# Patient Record
Sex: Male | Born: 1970 | Race: White | Hispanic: No | Marital: Single | Smoking: Current every day smoker
Health system: Southern US, Community
[De-identification: ages and names within clinical notes are randomized; demographics above are authoritative.]

---

## 2004-05-02 ENCOUNTER — Emergency Department (HOSPITAL_COMMUNITY): Admission: EM | Admit: 2004-05-02 | Discharge: 2004-05-02 | Payer: Self-pay | Admitting: Family Medicine

## 2006-11-10 ENCOUNTER — Emergency Department (HOSPITAL_COMMUNITY): Admission: EM | Admit: 2006-11-10 | Discharge: 2006-11-10 | Payer: Self-pay | Admitting: Emergency Medicine

## 2010-03-22 ENCOUNTER — Observation Stay (HOSPITAL_COMMUNITY): Admission: EM | Admit: 2010-03-22 | Discharge: 2010-03-23 | Payer: Self-pay | Admitting: Emergency Medicine

## 2010-03-22 DIAGNOSIS — Z8709 Personal history of other diseases of the respiratory system: Secondary | ICD-10-CM | POA: Insufficient documentation

## 2010-03-23 ENCOUNTER — Encounter: Payer: Self-pay | Admitting: Physician Assistant

## 2010-03-28 ENCOUNTER — Emergency Department (HOSPITAL_COMMUNITY): Admission: EM | Admit: 2010-03-28 | Discharge: 2010-03-28 | Payer: Self-pay | Admitting: Emergency Medicine

## 2010-10-03 NOTE — Letter (Signed)
Summary: Discharge Summary  Discharge Summary   Imported By: Arta Bruce 04/26/2010 10:46:17  _____________________________________________________________________  External Attachment:    Type:   Image     Comment:   External Document

## 2010-11-17 LAB — CBC
HCT: 40.3 % (ref 39.0–52.0)
Hemoglobin: 13.5 g/dL (ref 13.0–17.0)
MCH: 31.5 pg (ref 26.0–34.0)
MCH: 31.6 pg (ref 26.0–34.0)
MCHC: 33.4 g/dL (ref 30.0–36.0)
Platelets: 274 10*3/uL (ref 150–400)
RBC: 4.27 MIL/uL (ref 4.22–5.81)
RDW: 13.7 % (ref 11.5–15.5)
RDW: 14.3 % (ref 11.5–15.5)

## 2010-11-17 LAB — COMPREHENSIVE METABOLIC PANEL
AST: 28 U/L (ref 0–37)
Albumin: 1.7 g/dL — ABNORMAL LOW (ref 3.5–5.2)
Alkaline Phosphatase: 128 U/L — ABNORMAL HIGH (ref 39–117)
Chloride: 106 mEq/L (ref 96–112)
Creatinine, Ser: 1.66 mg/dL — ABNORMAL HIGH (ref 0.4–1.5)
GFR calc Af Amer: 56 mL/min — ABNORMAL LOW (ref >60)
Glucose, Bld: 108 mg/dL — ABNORMAL HIGH (ref 70–99)

## 2010-11-17 LAB — DIFFERENTIAL
Basophils Absolute: 0 10*3/uL (ref 0.0–0.1)
Basophils Relative: 0 % (ref 0–1)
Eosinophils Absolute: 0 10*3/uL (ref 0.0–0.7)
Lymphocytes Relative: 15 % (ref 12–46)
Lymphs Abs: 3.2 10*3/uL (ref 0.7–4.0)

## 2010-11-17 LAB — CULTURE, BLOOD (ROUTINE X 2): Culture: NO GROWTH

## 2010-11-17 LAB — RAPID URINE DRUG SCREEN, HOSP PERFORMED: Cocaine: NOT DETECTED

## 2010-11-17 LAB — POCT I-STAT, CHEM 8
BUN: 37 mg/dL — ABNORMAL HIGH (ref 6–23)
Sodium: 132 mEq/L — ABNORMAL LOW (ref 135–145)
TCO2: 24 mmol/L (ref 0–100)

## 2011-07-21 ENCOUNTER — Encounter: Payer: Self-pay | Admitting: *Deleted

## 2011-07-21 ENCOUNTER — Emergency Department (HOSPITAL_COMMUNITY)
Admission: EM | Admit: 2011-07-21 | Discharge: 2011-07-21 | Disposition: A | Discharge status: Home / Self Care | Payer: Self-pay | Attending: Emergency Medicine | Admitting: Emergency Medicine

## 2011-07-21 DIAGNOSIS — M545 Low back pain, unspecified: Secondary | ICD-10-CM | POA: Insufficient documentation

## 2011-07-21 DIAGNOSIS — M549 Dorsalgia, unspecified: Secondary | ICD-10-CM

## 2011-07-21 MED ORDER — IBUPROFEN 600 MG PO TABS: 600.0000 mg | ORAL_TABLET | Freq: Three times a day (TID) | ORAL | Status: AC | PRN

## 2011-07-21 MED ORDER — OXYCODONE-ACETAMINOPHEN 5-325 MG PO TABS
2.0000 | ORAL_TABLET | Freq: Once | ORAL | Status: AC
  Administered 2011-07-21: 2 via ORAL
  Filled 2011-07-21: qty 2

## 2011-07-21 MED ORDER — DIAZEPAM 5 MG PO TABS
5.0000 mg | ORAL_TABLET | Freq: Once | ORAL | Status: AC
  Administered 2011-07-21: 5 mg via ORAL
  Filled 2011-07-21: qty 1

## 2011-07-21 MED ORDER — HYDROCODONE-ACETAMINOPHEN 5-325 MG PO TABS: 1.0000 | ORAL_TABLET | ORAL | Status: AC | PRN
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
## 2011-07-21 NOTE — ED Provider Notes (Signed)
History     CSN: [id number] Arrival date & time: 07/21/2011  4:19 PM   First MD Initiated Contact with Patient 07/21/11 1735      Chief Complaint  Patient presents with  . Back Pain    (Consider location/radiation/quality/duration/timing/severity/associated sxs/prior treatment) Patient is a 40 y.o. male presenting with back pain. The history is provided by the patient.  Back Pain    patient reports history of chronic low back pain that at times has exacerbations.  Reports worsening right back pain with radiation under his right thigh that started 2 days ago.  Spent on improved by ibuprofen at home.  Is worsened by movement and by palpation.  His had no weakness or numbness of his legs.  He said no paranasal numbness.  He's had no difficulty urinating.  No fever or chills.  History of similar symptoms before in the past.  A back injury approximately 20 years ago and since has had exacerbations of his pain.  Is not on any chronic narcotics.  His symptoms are moderate to severe they're constant.  History reviewed. No pertinent past medical history.  History reviewed. No pertinent past surgical history.  History reviewed. No pertinent family history.  History  Substance Use Topics  . Smoking status: Current Everyday Smoker -- 1.0 packs/day    Types: Cigarettes  . Smokeless tobacco: Not on file  . Alcohol Use: No      Review of Systems  Musculoskeletal: Positive for back pain.  All other systems reviewed and are negative.    Allergies  Review of patient's allergies indicates no known allergies.  Home Medications   Current Outpatient Rx  Name Route Sig Dispense Refill  . HYDROCODONE-ACETAMINOPHEN 5-325 MG PO TABS Oral Take 1 tablet by mouth every 4 (four) hours as needed for pain. 20 tablet 0  . IBUPROFEN 600 MG PO TABS Oral Take 1 tablet (600 mg total) by mouth every 8 (eight) hours as needed for pain. 15 tablet 0    BP 105/64  Pulse 106  Temp(Src) 97.9 F (36.6 C)  (Oral)  Resp 18  SpO2 99%  Physical Exam  Constitutional: He is oriented to person, place, and time. He appears well-developed and well-nourished.  HENT:  Head: Normocephalic.  Eyes: EOM are normal.  Neck: Normal range of motion.  Pulmonary/Chest: Effort normal.  Musculoskeletal: Normal range of motion.       No thoracic or lumbar spine tenderness.  Right paralumbar spinal tenderness.   Neurological: He is alert and oriented to person, place, and time.       Normal strength 5 out of 5 in his right lower extremity major muscle groups.   Skin: Skin is warm and dry.  Psychiatric: He has a normal mood and affect.    ED Course  Procedures (including critical care time)  Labs Reviewed - No data to display No results found.   1. Back pain       MDM  Normal lower extremity neurologic exam. No bowel or bladder complaints. No back pain red flags. Likely musculoskeletal back pain. Doubt spinal epidural abscess. Doubt cauda equina. Doubt abdominal aortic aneurysm         Lyanne Co, MD 07/21/11 [phone]

## 2011-09-15 IMAGING — CR DG CHEST 2V
2 series · 2 of 2 positions shown · non-contrast
Comparison: None.

CLINICAL DATA: Shortness of breath, chest pain

CHEST - 2 VIEW

[w chest pa]
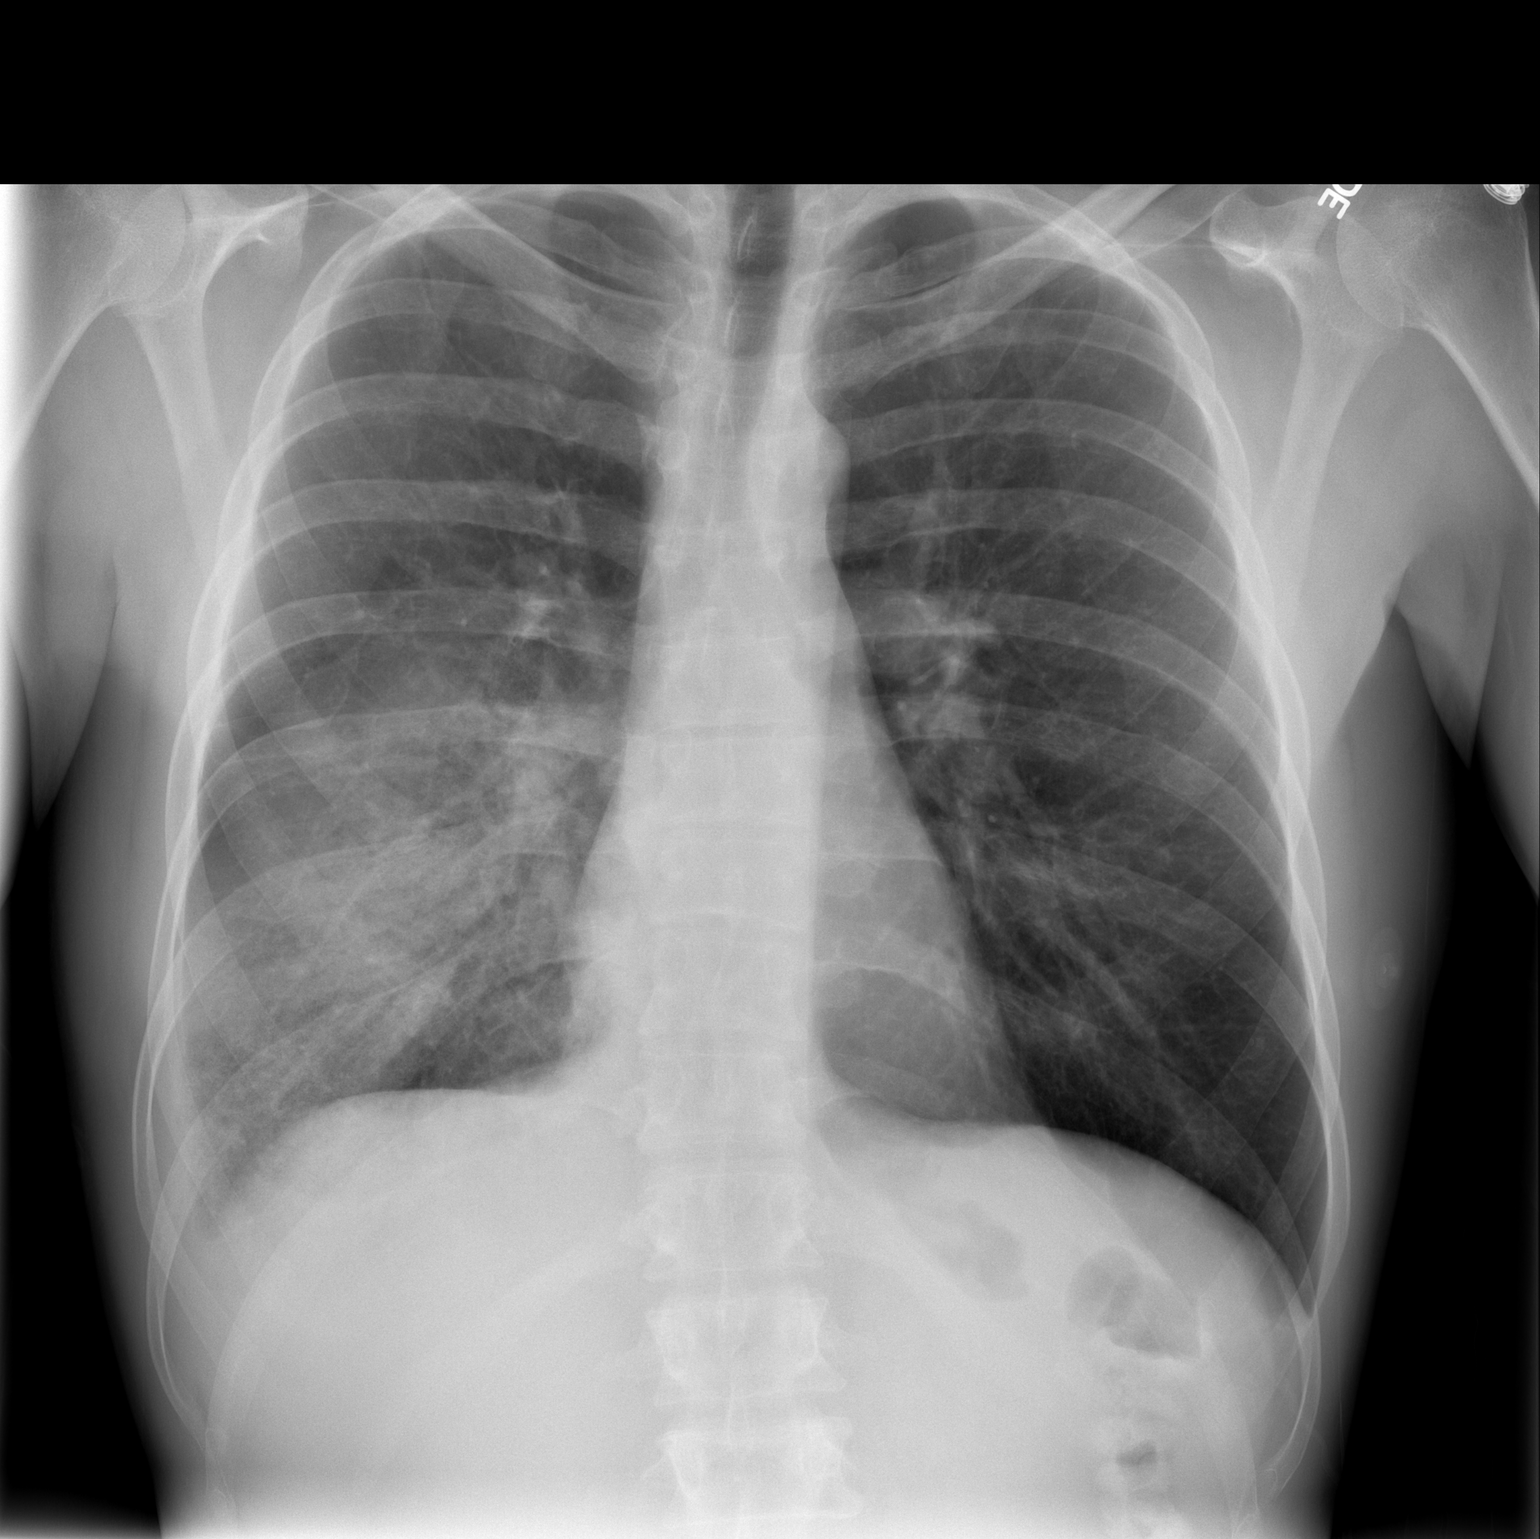

[w chest lat]
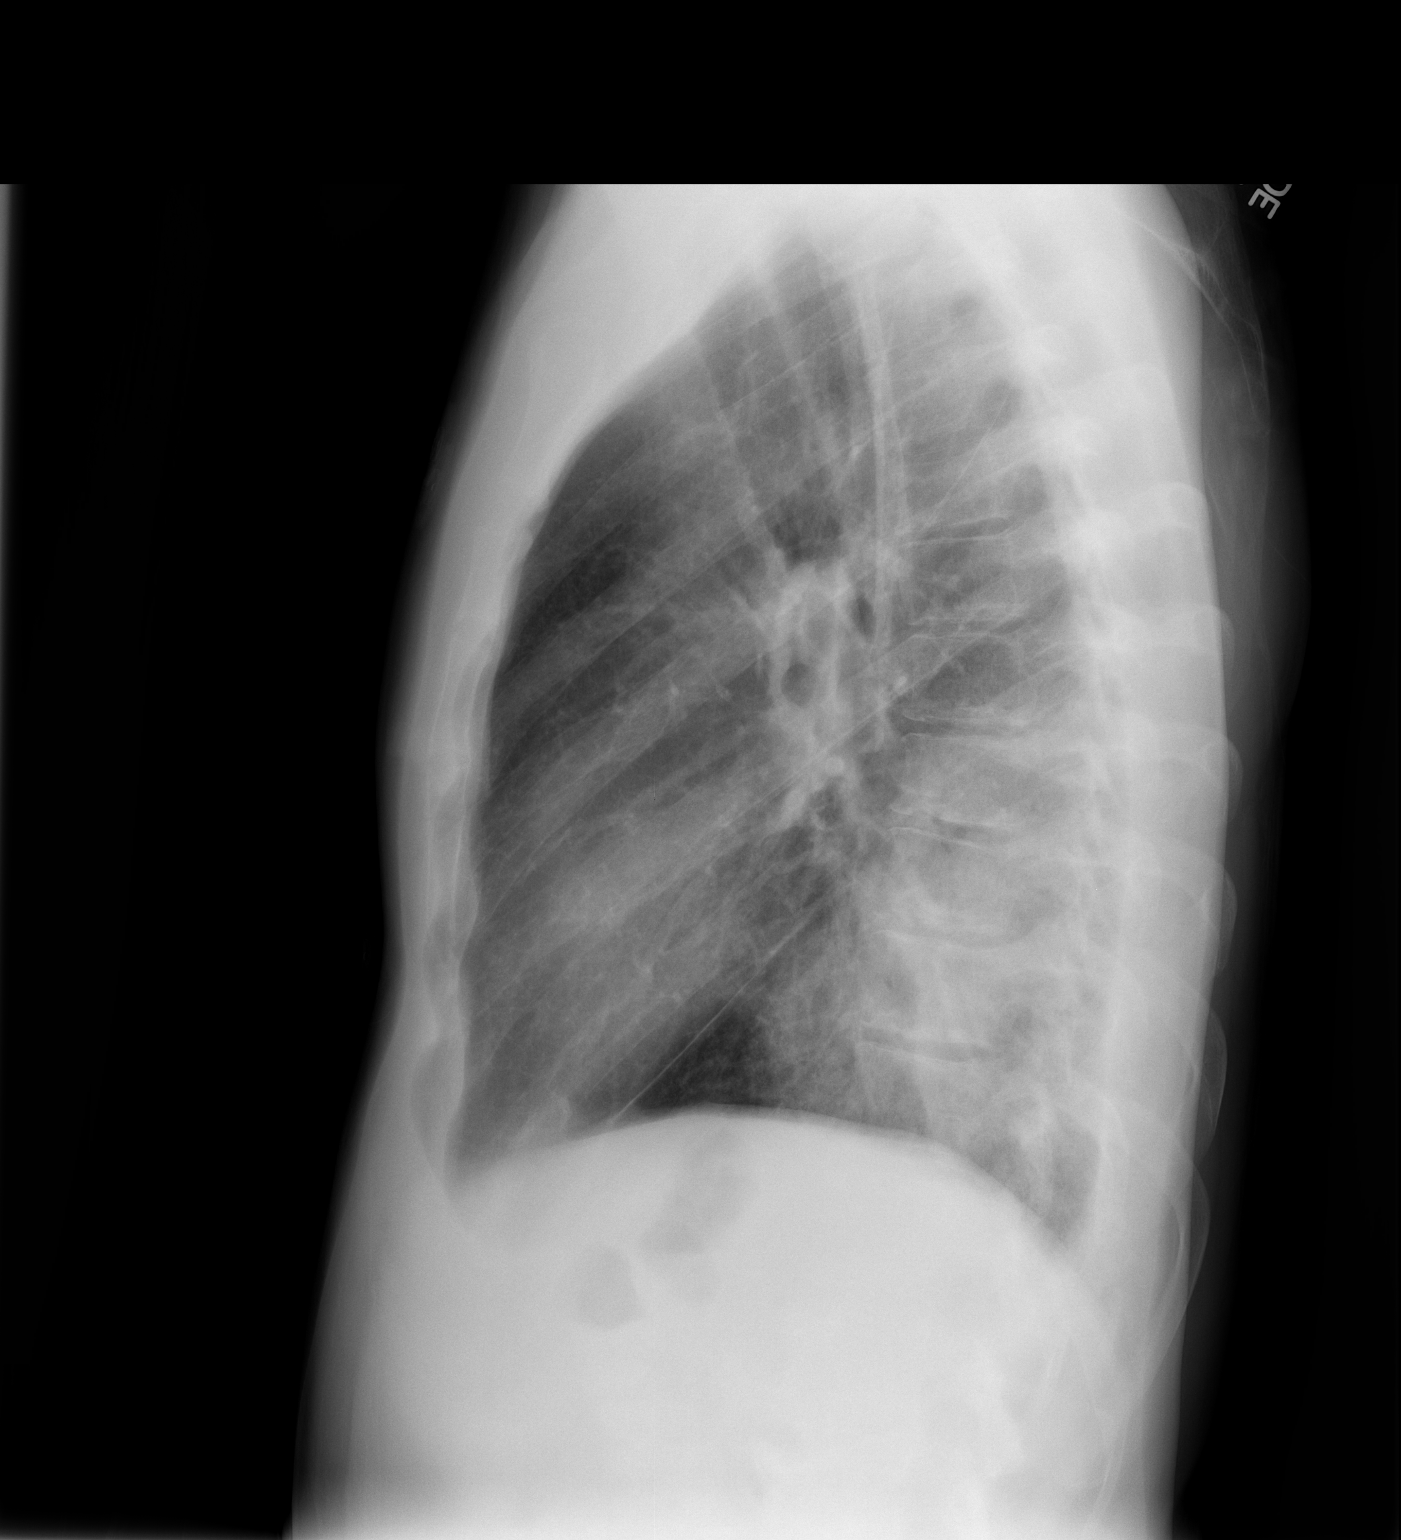

[2 of 2 positions shown; findings below may reference images not displayed]

FINDINGS: Cardiomediastinal silhouette is unremarkable.  There is
infiltrate with some air bronchogram in the right lower lobe
consistent with pneumonia.  Probable trace right pleural effusion.
No pulmonary edema .
IMPRESSION: Infiltrate/pneumonia in the right lower lobe.  No pulmonary edema.
Follow-up to resolution after appropriate treatment is recommended.

## 2016-03-14 ENCOUNTER — Encounter (HOSPITAL_COMMUNITY): Payer: Self-pay | Admitting: Emergency Medicine

## 2016-03-14 ENCOUNTER — Emergency Department (HOSPITAL_COMMUNITY): Payer: No Typology Code available for payment source

## 2016-03-14 ENCOUNTER — Emergency Department (HOSPITAL_COMMUNITY)
Admission: EM | Admit: 2016-03-14 | Discharge: 2016-03-14 | Disposition: A | Discharge status: Home / Self Care | Payer: No Typology Code available for payment source | Attending: Emergency Medicine | Admitting: Emergency Medicine

## 2016-03-14 DIAGNOSIS — Y939 Activity, unspecified: Secondary | ICD-10-CM | POA: Insufficient documentation

## 2016-03-14 DIAGNOSIS — F17210 Nicotine dependence, cigarettes, uncomplicated: Secondary | ICD-10-CM | POA: Insufficient documentation

## 2016-03-14 DIAGNOSIS — Y999 Unspecified external cause status: Secondary | ICD-10-CM | POA: Diagnosis not present

## 2016-03-14 DIAGNOSIS — S2242XA Multiple fractures of ribs, left side, initial encounter for closed fracture: Secondary | ICD-10-CM | POA: Diagnosis not present

## 2016-03-14 DIAGNOSIS — S299XXA Unspecified injury of thorax, initial encounter: Secondary | ICD-10-CM | POA: Diagnosis present

## 2016-03-14 DIAGNOSIS — Y92410 Unspecified street and highway as the place of occurrence of the external cause: Secondary | ICD-10-CM | POA: Insufficient documentation

## 2016-03-14 DIAGNOSIS — S2232XA Fracture of one rib, left side, initial encounter for closed fracture: Secondary | ICD-10-CM

## 2016-03-14 LAB — CBC WITH DIFFERENTIAL/PLATELET
BASOS PCT: 0 %
Basophils Absolute: 0 10*3/uL (ref 0.0–0.1)
EOS ABS: 0.1 10*3/uL (ref 0.0–0.7)
Eosinophils Relative: 1 %
HCT: 42.3 % (ref 39.0–52.0)
HEMOGLOBIN: 13.7 g/dL (ref 13.0–17.0)
Lymphocytes Relative: 28 %
Lymphs Abs: 1.8 10*3/uL (ref 0.7–4.0)
MCH: 31.1 pg (ref 26.0–34.0)
MCHC: 32.4 g/dL (ref 30.0–36.0)
MCV: 96.1 fL (ref 78.0–100.0)
Monocytes Absolute: 0.4 10*3/uL (ref 0.1–1.0)
Monocytes Relative: 7 %
NEUTROS PCT: 64 %
Neutro Abs: 4.2 10*3/uL (ref 1.7–7.7)
Platelets: 214 10*3/uL (ref 150–400)
RBC: 4.4 MIL/uL (ref 4.22–5.81)
RDW: 12.4 % (ref 11.5–15.5)
WBC: 6.5 10*3/uL (ref 4.0–10.5)

## 2016-03-14 LAB — BASIC METABOLIC PANEL
Anion gap: 10 (ref 5–15)
BUN: 12 mg/dL (ref 6–20)
CALCIUM: 9.5 mg/dL (ref 8.9–10.3)
CO2: 24 mmol/L (ref 22–32)
CREATININE: 0.77 mg/dL (ref 0.61–1.24)
Chloride: 103 mmol/L (ref 101–111)
Glucose, Bld: 98 mg/dL (ref 65–99)
Potassium: 4.1 mmol/L (ref 3.5–5.1)
SODIUM: 137 mmol/L (ref 135–145)

## 2016-03-14 MED ORDER — OXYCODONE-ACETAMINOPHEN 5-325 MG PO TABS
2.0000 | ORAL_TABLET | Freq: Once | ORAL | Status: AC
  Administered 2016-03-14: 2 via ORAL
  Filled 2016-03-14: qty 2

## 2016-03-14 MED ORDER — HYDROCODONE-ACETAMINOPHEN 5-325 MG PO TABS: 1.0000 | ORAL_TABLET | Freq: Four times a day (QID) | ORAL | Status: DC | PRN

## 2016-03-14 MED ORDER — NAPROXEN 500 MG PO TABS: 500.0000 mg | ORAL_TABLET | Freq: Two times a day (BID) | ORAL | Status: DC

## 2016-03-14 NOTE — Discharge Instructions (Signed)
Rib Fracture °A rib fracture is a break or crack in one of the bones of the ribs. The ribs are a group of long, curved bones that wrap around your chest and attach to your spine. They protect your lungs and other organs in the chest cavity. A broken or cracked rib is often painful, but most do not cause other problems. Most rib fractures heal on their own over time. However, rib fractures can be more serious if multiple ribs are broken or if broken ribs move out of place and push against other structures. °CAUSES  °· A direct blow to the chest. For example, this could happen during contact sports, a car accident, or a fall against a hard object. °· Repetitive movements with high force, such as pitching a baseball or having severe coughing spells. °SYMPTOMS  °· Pain when you breathe in or cough. °· Pain when someone presses on the injured area. °DIAGNOSIS  °Your caregiver will perform a physical exam. Various imaging tests may be ordered to confirm the diagnosis and to look for related injuries. These tests may include a chest X-ray, computed tomography (CT), magnetic resonance imaging (MRI), or a bone scan. °TREATMENT  °Rib fractures usually heal on their own in 1-3 months. The longer healing period is often associated with a continued cough or other aggravating activities. During the healing period, pain control is very important. Medication is usually given to control pain. Hospitalization or surgery may be needed for more severe injuries, such as those in which multiple ribs are broken or the ribs have moved out of place.  °HOME CARE INSTRUCTIONS  °1. Avoid strenuous activity and any activities or movements that cause pain. Be careful during activities and avoid bumping the injured rib. °2. Gradually increase activity as directed by your caregiver. °3. Only take over-the-counter or prescription medications as directed by your caregiver. Do not take other medications without asking your caregiver first. °4. Apply  ice to the injured area for the first 1-2 days after you have been treated or as directed by your caregiver. Applying ice helps to reduce inflammation and pain. °1. Put ice in a plastic bag. °2. Place a towel between your skin and the bag.   °3. Leave the ice on for 15-20 minutes at a time, every 2 hours while you are awake. °5. Perform deep breathing as directed by your caregiver. This will help prevent pneumonia, which is a common complication of a broken rib. Your caregiver may instruct you to: °1. Take deep breaths several times a day. °2. Try to cough several times a day, holding a pillow against the injured area. °3. Use a device called an incentive spirometer to practice deep breathing several times a day. °6. Drink enough fluids to keep your urine clear or pale yellow. This will help you avoid constipation.   °7. Do not wear a rib belt or binder. These restrict breathing, which can lead to pneumonia.   °SEEK IMMEDIATE MEDICAL CARE IF:  °· You have a fever.   °· You have difficulty breathing or shortness of breath.   °· You develop a continual cough, or you cough up thick or bloody sputum. °· You feel sick to your stomach (nausea), throw up (vomit), or have abdominal pain.   °· You have worsening pain not controlled with medications.   °MAKE SURE YOU: °· Understand these instructions. °· Will watch your condition. °· Will get help right away if you are not doing well or get worse. °  °This information is not intended to   replace advice given to you by your health care provider. Make sure you discuss any questions you have with your health care provider.   Document Released: 08/19/2005 Document Revised: 04/21/2013 Document Reviewed: 10/21/2012 Elsevier Interactive Patient Education 2016 [employer]. Incentive Spirometer An incentive spirometer is a tool that can help keep your lungs clear and active. This tool measures how well you are filling your lungs with each breath. Taking long, deep breaths may  help reverse or decrease the chance of developing breathing (pulmonary) problems (especially infection) following:  Surgery of the chest or abdomen.  Surgery if you have a history of smoking or a lung problem.  A long period of time when you are unable to move or be active. BEFORE THE PROCEDURE   If the spirometer includes an indicator to show your best effort, your nurse or respiratory therapist will set it to a desired goal.  If possible, sit up straight or lean slightly forward. Try not to slouch.  Hold the incentive spirometer in an upright position. INSTRUCTIONS FOR USE  8. Sit on the edge of your bed if possible, or sit up as far as you can in bed or on a chair. 9. Hold the incentive spirometer in an upright position. 10. Breathe out normally. 11. Place the mouthpiece in your mouth and seal your lips tightly around it. 12. Breathe in slowly and as deeply as possible, raising the piston or the ball toward the top of the column. 13. Hold your breath for 3-5 seconds or for as long as possible. Allow the piston or ball to fall to the bottom of the column. 14. Remove the mouthpiece from your mouth and breathe out normally. 15. Rest for a few seconds and repeat Steps 1 through 7 at least 10 times every 1-2 hours when you are awake. Take your time and take a few normal breaths between deep breaths. 16. The spirometer may include an indicator to show your best effort. Use the indicator as a goal to work toward during each repetition. 17. After each set of 10 deep breaths, practice coughing to be sure your lungs are clear. If you have an incision (the cut made at the time of surgery), support your incision when coughing by placing a pillow or rolled-up towels firmly against it. Once you are able to get out of bed, walk around indoors and cough well. You may stop using the incentive spirometer when instructed by your caregiver.  RISKS AND COMPLICATIONS  Breathing too quickly may cause  dizziness. At an extreme, this could cause you to pass out. Take your time so you do not get dizzy or light-headed.  If you are in pain, you may need to take or ask for pain medication before doing incentive spirometry. It is harder to take a deep breath if you are having pain. AFTER USE  Rest and breathe slowly and easily.  It can be helpful to keep a log of your progress. Your caregiver can provide you with a simple table to help with this. If you are using the spirometer at home, follow these instructions: SEEK MEDICAL CARE IF:   You are having difficultly using the spirometer.  You have trouble using the spirometer as often as instructed.  Your pain medication is not giving enough relief while using the spirometer.  You develop fever of 100.11F (38.1C) or higher. SEEK IMMEDIATE MEDICAL CARE IF:   You cough up bloody sputum that had not been present before.  You develop fever  of 102F (38.9C) or greater.  You develop worsening pain at or near the incision site. MAKE SURE YOU:   Understand these instructions.  Will watch your condition.  Will get help right away if you are not doing well or get worse.   This information is not intended to replace advice given to you by your health care provider. Make sure you discuss any questions you have with your health care provider.   Document Released: 12/30/2006 Document Revised: 09/09/2014 Document Reviewed: 03/28/2014 Elsevier Interactive Patient Education [employer]2016 Else[employer] Inc.
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
## 2019-05-26 ENCOUNTER — Emergency Department (HOSPITAL_COMMUNITY): Payer: Self-pay

## 2019-05-26 ENCOUNTER — Emergency Department (HOSPITAL_COMMUNITY)
Admission: EM | Admit: 2019-05-26 | Discharge: 2019-05-26 | Disposition: A | Discharge status: Home / Self Care | Payer: Self-pay | Attending: Emergency Medicine | Admitting: Emergency Medicine

## 2019-05-26 ENCOUNTER — Other Ambulatory Visit: Payer: Self-pay

## 2019-05-26 ENCOUNTER — Encounter (HOSPITAL_COMMUNITY): Payer: Self-pay

## 2019-05-26 DIAGNOSIS — M25572 Pain in left ankle and joints of left foot: Secondary | ICD-10-CM | POA: Insufficient documentation

## 2019-05-26 DIAGNOSIS — Y9355 Activity, bike riding: Secondary | ICD-10-CM | POA: Insufficient documentation

## 2019-05-26 DIAGNOSIS — Z23 Encounter for immunization: Secondary | ICD-10-CM | POA: Insufficient documentation

## 2019-05-26 DIAGNOSIS — Z791 Long term (current) use of non-steroidal anti-inflammatories (NSAID): Secondary | ICD-10-CM | POA: Insufficient documentation

## 2019-05-26 DIAGNOSIS — Y929 Unspecified place or not applicable: Secondary | ICD-10-CM | POA: Insufficient documentation

## 2019-05-26 DIAGNOSIS — M25512 Pain in left shoulder: Secondary | ICD-10-CM | POA: Insufficient documentation

## 2019-05-26 DIAGNOSIS — S82832A Other fracture of upper and lower end of left fibula, initial encounter for closed fracture: Secondary | ICD-10-CM | POA: Insufficient documentation

## 2019-05-26 DIAGNOSIS — F17210 Nicotine dependence, cigarettes, uncomplicated: Secondary | ICD-10-CM | POA: Insufficient documentation

## 2019-05-26 DIAGNOSIS — Y999 Unspecified external cause status: Secondary | ICD-10-CM | POA: Insufficient documentation

## 2019-05-26 DIAGNOSIS — M25562 Pain in left knee: Secondary | ICD-10-CM | POA: Insufficient documentation

## 2019-05-26 MED ORDER — HYDROCODONE-ACETAMINOPHEN 5-325 MG PO TABS
1.0000 | ORAL_TABLET | Freq: Once | ORAL | Status: AC
  Administered 2019-05-26: 1 via ORAL
  Filled 2019-05-26: qty 1

## 2019-05-26 MED ORDER — TETANUS-DIPHTH-ACELL PERTUSSIS 5-2.5-18.5 LF-MCG/0.5 IM SUSP
0.5000 mL | Freq: Once | INTRAMUSCULAR | Status: AC
  Administered 2019-05-26: 11:00:00 0.5 mL via INTRAMUSCULAR
  Filled 2019-05-26: qty 0.5

## 2019-05-26 MED ORDER — HYDROCODONE-ACETAMINOPHEN 5-325 MG PO TABS: 1.0000 | ORAL_TABLET | Freq: Four times a day (QID) | ORAL | 0 refills | Status: DC | PRN

## 2019-05-26 MED ORDER — CEPHALEXIN 500 MG PO CAPS: 500.0000 mg | ORAL_CAPSULE | Freq: Four times a day (QID) | ORAL | 0 refills | Status: AC

## 2019-05-26 NOTE — ED Notes (Signed)
Ortho paged regarding splint and crutches.
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
## 2019-05-26 NOTE — ED Notes (Signed)
Returned from radiology, spouse at bedside

## 2019-05-26 NOTE — Discharge Instructions (Addendum)
In case of infection around your knee, take Keflex as prescribed until completed.  For severe breakthrough pain, you can take 1-2 Vicodin every 6 hours.  Do not drive or operate machinery while taking this medication.  You can ibuprofen or naproxen over-the-counter as well.  Keep your leg elevated whenever you are not walking on it.  Do not weight-bear at all and use crutches until you are seen by the orthopedic doctor and I given further evaluation recommendation.  Please return emergency department if you develop any complete numbness of your toes, color changing of your toes, hardening of your leg below the splint, or if your splint gets wet.  Do not drink alcohol, drive, operate machinery or participate in any other potentially dangerous activities while taking opiate pain medication as it may make you sleepy. Do not take this medication with any other sedating medications, either prescription or over-the-counter. If you were prescribed Percocet or Vicodin, do not take these with acetaminophen (Tylenol) as it is already contained within these medications and overdose of Tylenol is dangerous.   This medication is an opiate (or narcotic) pain medication and can be habit forming.  Use it as little as possible to achieve adequate pain control.  Do not use or use it with extreme caution if you have a history of opiate abuse or dependence. This medication is intended for your use only - do not give any to anyone else and keep it in a secure place where nobody else, especially children, have access to it. It will also cause or worsen constipation, so you may want to consider taking an over-the-counter stool softener while you are taking this medication.

## 2019-05-26 NOTE — Progress Notes (Signed)
Orthopedic Tech Progress Note Patient Details:  Noah Parrish 1970/11/15 [id number]  Ortho Devices Type of Ortho Device: Short leg splint, Crutches Ortho Device/Splint Location: LLE Ortho Device/Splint Interventions: Adjustment, Application, Ordered   Post Interventions Patient Tolerated: Well Instructions Provided: Care of device, Adjustment of device   Janit Pagan 05/26/2019, 12:04 PM
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
## 2019-10-27 DIAGNOSIS — Z20828 Contact with and (suspected) exposure to other viral communicable diseases: Secondary | ICD-10-CM | POA: Diagnosis not present

## 2019-10-27 DIAGNOSIS — Z03818 Encounter for observation for suspected exposure to other biological agents ruled out: Secondary | ICD-10-CM | POA: Diagnosis not present

## 2019-10-31 DIAGNOSIS — Z20828 Contact with and (suspected) exposure to other viral communicable diseases: Secondary | ICD-10-CM | POA: Diagnosis not present

## 2019-10-31 DIAGNOSIS — Z03818 Encounter for observation for suspected exposure to other biological agents ruled out: Secondary | ICD-10-CM | POA: Diagnosis not present

## 2019-11-08 DIAGNOSIS — F900 Attention-deficit hyperactivity disorder, predominantly inattentive type: Secondary | ICD-10-CM | POA: Diagnosis not present

## 2019-11-23 DIAGNOSIS — Z125 Encounter for screening for malignant neoplasm of prostate: Secondary | ICD-10-CM | POA: Diagnosis not present

## 2019-11-23 DIAGNOSIS — Z0000 Encounter for general adult medical examination without abnormal findings: Secondary | ICD-10-CM | POA: Diagnosis not present

## 2019-11-23 DIAGNOSIS — Z13220 Encounter for screening for lipoid disorders: Secondary | ICD-10-CM | POA: Diagnosis not present

## 2019-11-23 DIAGNOSIS — F17200 Nicotine dependence, unspecified, uncomplicated: Secondary | ICD-10-CM | POA: Diagnosis not present

## 2019-12-02 DIAGNOSIS — B360 Pityriasis versicolor: Secondary | ICD-10-CM | POA: Diagnosis not present

## 2019-12-11 DIAGNOSIS — F900 Attention-deficit hyperactivity disorder, predominantly inattentive type: Secondary | ICD-10-CM | POA: Diagnosis not present

## 2020-03-15 DIAGNOSIS — F17200 Nicotine dependence, unspecified, uncomplicated: Secondary | ICD-10-CM | POA: Diagnosis not present

## 2020-06-09 DIAGNOSIS — F900 Attention-deficit hyperactivity disorder, predominantly inattentive type: Secondary | ICD-10-CM | POA: Diagnosis not present

## 2020-12-12 DIAGNOSIS — B360 Pityriasis versicolor: Secondary | ICD-10-CM | POA: Diagnosis not present

## 2020-12-12 DIAGNOSIS — Z1211 Encounter for screening for malignant neoplasm of colon: Secondary | ICD-10-CM | POA: Diagnosis not present

## 2020-12-12 DIAGNOSIS — M25562 Pain in left knee: Secondary | ICD-10-CM | POA: Diagnosis not present

## 2020-12-12 DIAGNOSIS — Z13220 Encounter for screening for lipoid disorders: Secondary | ICD-10-CM | POA: Diagnosis not present

## 2020-12-12 DIAGNOSIS — F17200 Nicotine dependence, unspecified, uncomplicated: Secondary | ICD-10-CM | POA: Diagnosis not present

## 2020-12-12 DIAGNOSIS — Z0000 Encounter for general adult medical examination without abnormal findings: Secondary | ICD-10-CM | POA: Diagnosis not present

## 2020-12-13 DIAGNOSIS — F900 Attention-deficit hyperactivity disorder, predominantly inattentive type: Secondary | ICD-10-CM | POA: Diagnosis not present

## 2020-12-13 DIAGNOSIS — M25562 Pain in left knee: Secondary | ICD-10-CM | POA: Diagnosis not present

## 2022-01-31 ENCOUNTER — Other Ambulatory Visit: Payer: Self-pay

## 2022-01-31 ENCOUNTER — Emergency Department (HOSPITAL_COMMUNITY): Payer: Self-pay

## 2022-01-31 ENCOUNTER — Emergency Department (HOSPITAL_COMMUNITY)
Admission: EM | Admit: 2022-01-31 | Discharge: 2022-01-31 | Disposition: A | Discharge status: Home / Self Care | Payer: Self-pay | Attending: Emergency Medicine | Admitting: Emergency Medicine

## 2022-01-31 DIAGNOSIS — R109 Unspecified abdominal pain: Secondary | ICD-10-CM | POA: Diagnosis not present

## 2022-01-31 LAB — I-STAT CHEM 8, ED
BUN: 20 mg/dL (ref 6–20)
Calcium, Ion: 1.16 mmol/L (ref 1.15–1.40)
Chloride: 107 mmol/L (ref 98–111)
Creatinine, Ser: 0.8 mg/dL (ref 0.61–1.24)
Glucose, Bld: 109 mg/dL — ABNORMAL HIGH (ref 70–99)
HCT: 43 % (ref 39.0–52.0)
Hemoglobin: 14.6 g/dL (ref 13.0–17.0)
Potassium: 4 mmol/L (ref 3.5–5.1)
Sodium: 141 mmol/L (ref 135–145)
TCO2: 24 mmol/L (ref 22–32)

## 2022-01-31 MED ORDER — MORPHINE SULFATE (PF) 4 MG/ML IV SOLN
4.0000 mg | Freq: Once | INTRAVENOUS | Status: AC
  Administered 2022-01-31: 4 mg via INTRAVENOUS
  Filled 2022-01-31: qty 1

## 2022-01-31 MED ORDER — IOHEXOL 300 MG/ML  SOLN
100.0000 mL | Freq: Once | INTRAMUSCULAR | Status: AC | PRN
  Administered 2022-01-31: 100 mL via INTRAVENOUS

## 2022-01-31 MED ORDER — CYCLOBENZAPRINE HCL 10 MG PO TABS: 10.0000 mg | ORAL_TABLET | Freq: Two times a day (BID) | ORAL | 0 refills | Status: AC | PRN

## 2022-01-31 MED ORDER — HYDROCODONE-ACETAMINOPHEN 5-325 MG PO TABS: 1.0000 | ORAL_TABLET | Freq: Four times a day (QID) | ORAL | 0 refills | Status: AC | PRN

## 2022-01-31 MED ORDER — NAPROXEN 500 MG PO TABS: 500.0000 mg | ORAL_TABLET | Freq: Two times a day (BID) | ORAL | 0 refills | Status: AC
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
## 2022-01-31 NOTE — ED Notes (Signed)
Patient transported to CT 

## 2022-01-31 NOTE — Discharge Instructions (Signed)
The CT scans did not show any signs of fractures or internal injury.  Take the medications as needed for pain and discomfort.  Follow-up with your doctor to be rechecked if the symptoms are not resolving in the next week
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
